# Patient Record
Sex: Female | Born: 1985 | Race: White | Hispanic: No | Marital: Married | Smoking: Never smoker
Health system: Southern US, Community
[De-identification: ages and names within clinical notes are randomized; demographics above are authoritative.]

## PROBLEM LIST (undated history)

## (undated) HISTORY — PX: WISDOM TOOTH EXTRACTION: SHX21

---

## 2016-02-18 DIAGNOSIS — O480 Post-term pregnancy: Secondary | ICD-10-CM | POA: Diagnosis not present

## 2016-02-22 DIAGNOSIS — O480 Post-term pregnancy: Secondary | ICD-10-CM | POA: Diagnosis not present

## 2016-02-22 DIAGNOSIS — Z3A41 41 weeks gestation of pregnancy: Secondary | ICD-10-CM | POA: Diagnosis not present

## 2016-02-22 DIAGNOSIS — Z3483 Encounter for supervision of other normal pregnancy, third trimester: Secondary | ICD-10-CM | POA: Diagnosis not present

## 2016-02-23 DIAGNOSIS — Z3A41 41 weeks gestation of pregnancy: Secondary | ICD-10-CM | POA: Diagnosis not present

## 2016-02-23 DIAGNOSIS — O480 Post-term pregnancy: Secondary | ICD-10-CM | POA: Diagnosis not present

## 2016-03-31 DIAGNOSIS — Z23 Encounter for immunization: Secondary | ICD-10-CM | POA: Diagnosis not present

## 2016-04-02 DIAGNOSIS — L918 Other hypertrophic disorders of the skin: Secondary | ICD-10-CM | POA: Diagnosis not present

## 2016-04-02 DIAGNOSIS — L700 Acne vulgaris: Secondary | ICD-10-CM | POA: Diagnosis not present

## 2016-07-08 DIAGNOSIS — K589 Irritable bowel syndrome without diarrhea: Secondary | ICD-10-CM | POA: Diagnosis not present

## 2016-07-08 DIAGNOSIS — K602 Anal fissure, unspecified: Secondary | ICD-10-CM | POA: Diagnosis not present

## 2017-01-18 DIAGNOSIS — K6289 Other specified diseases of anus and rectum: Secondary | ICD-10-CM | POA: Diagnosis not present

## 2017-01-18 DIAGNOSIS — K644 Residual hemorrhoidal skin tags: Secondary | ICD-10-CM | POA: Diagnosis not present

## 2017-02-22 DIAGNOSIS — K602 Anal fissure, unspecified: Secondary | ICD-10-CM | POA: Diagnosis not present

## 2017-02-22 DIAGNOSIS — K6289 Other specified diseases of anus and rectum: Secondary | ICD-10-CM | POA: Diagnosis not present

## 2017-02-22 DIAGNOSIS — R634 Abnormal weight loss: Secondary | ICD-10-CM | POA: Diagnosis not present

## 2017-02-22 DIAGNOSIS — K625 Hemorrhage of anus and rectum: Secondary | ICD-10-CM | POA: Diagnosis not present

## 2017-07-13 DIAGNOSIS — Z1151 Encounter for screening for human papillomavirus (HPV): Secondary | ICD-10-CM | POA: Diagnosis not present

## 2017-07-13 DIAGNOSIS — Z681 Body mass index (BMI) 19 or less, adult: Secondary | ICD-10-CM | POA: Diagnosis not present

## 2017-07-13 DIAGNOSIS — Z01419 Encounter for gynecological examination (general) (routine) without abnormal findings: Secondary | ICD-10-CM | POA: Diagnosis not present

## 2017-07-22 DIAGNOSIS — R5383 Other fatigue: Secondary | ICD-10-CM | POA: Diagnosis not present

## 2017-07-22 DIAGNOSIS — H6983 Other specified disorders of Eustachian tube, bilateral: Secondary | ICD-10-CM | POA: Diagnosis not present

## 2017-07-22 DIAGNOSIS — R5381 Other malaise: Secondary | ICD-10-CM | POA: Diagnosis not present

## 2017-07-22 DIAGNOSIS — H9203 Otalgia, bilateral: Secondary | ICD-10-CM | POA: Diagnosis not present

## 2017-07-22 DIAGNOSIS — J029 Acute pharyngitis, unspecified: Secondary | ICD-10-CM | POA: Diagnosis not present

## 2017-07-22 DIAGNOSIS — J028 Acute pharyngitis due to other specified organisms: Secondary | ICD-10-CM | POA: Diagnosis not present

## 2017-12-16 DIAGNOSIS — D2239 Melanocytic nevi of other parts of face: Secondary | ICD-10-CM | POA: Diagnosis not present

## 2017-12-16 DIAGNOSIS — L813 Cafe au lait spots: Secondary | ICD-10-CM | POA: Diagnosis not present

## 2017-12-16 DIAGNOSIS — D2271 Melanocytic nevi of right lower limb, including hip: Secondary | ICD-10-CM | POA: Diagnosis not present

## 2018-01-13 DIAGNOSIS — R1013 Epigastric pain: Secondary | ICD-10-CM | POA: Diagnosis not present

## 2018-01-13 DIAGNOSIS — Z13220 Encounter for screening for lipoid disorders: Secondary | ICD-10-CM | POA: Diagnosis not present

## 2018-01-13 DIAGNOSIS — K648 Other hemorrhoids: Secondary | ICD-10-CM | POA: Diagnosis not present

## 2018-01-25 DIAGNOSIS — Z0001 Encounter for general adult medical examination with abnormal findings: Secondary | ICD-10-CM | POA: Diagnosis not present

## 2018-01-25 DIAGNOSIS — R17 Unspecified jaundice: Secondary | ICD-10-CM | POA: Diagnosis not present

## 2018-01-25 DIAGNOSIS — R1013 Epigastric pain: Secondary | ICD-10-CM | POA: Diagnosis not present

## 2018-07-25 ENCOUNTER — Other Ambulatory Visit: Payer: Self-pay | Admitting: Internal Medicine

## 2018-07-25 DIAGNOSIS — R110 Nausea: Secondary | ICD-10-CM | POA: Diagnosis not present

## 2018-07-25 DIAGNOSIS — R17 Unspecified jaundice: Secondary | ICD-10-CM | POA: Diagnosis not present

## 2018-07-25 DIAGNOSIS — R002 Palpitations: Secondary | ICD-10-CM | POA: Diagnosis not present

## 2018-07-25 DIAGNOSIS — G4489 Other headache syndrome: Secondary | ICD-10-CM | POA: Diagnosis not present

## 2018-07-28 DIAGNOSIS — Z681 Body mass index (BMI) 19 or less, adult: Secondary | ICD-10-CM | POA: Diagnosis not present

## 2018-07-28 DIAGNOSIS — Z01419 Encounter for gynecological examination (general) (routine) without abnormal findings: Secondary | ICD-10-CM | POA: Diagnosis not present

## 2018-07-28 DIAGNOSIS — Z124 Encounter for screening for malignant neoplasm of cervix: Secondary | ICD-10-CM | POA: Diagnosis not present

## 2018-08-03 ENCOUNTER — Ambulatory Visit
Admission: RE | Admit: 2018-08-03 | Discharge: 2018-08-03 | Disposition: A | Discharge status: Home / Self Care | Payer: BLUE CROSS/BLUE SHIELD | Source: Ambulatory Visit | Attending: Internal Medicine | Admitting: Internal Medicine

## 2018-08-03 DIAGNOSIS — R17 Unspecified jaundice: Secondary | ICD-10-CM

## 2018-08-03 DIAGNOSIS — R110 Nausea: Secondary | ICD-10-CM

## 2018-08-03 MED ORDER — IOPAMIDOL (ISOVUE-300) INJECTION 61%
100.0000 mL | Freq: Once | INTRAVENOUS | Status: AC | PRN
  Administered 2018-08-03: 100 mL via INTRAVENOUS

## 2018-08-07 DIAGNOSIS — R002 Palpitations: Secondary | ICD-10-CM | POA: Diagnosis not present

## 2018-08-07 DIAGNOSIS — R17 Unspecified jaundice: Secondary | ICD-10-CM | POA: Diagnosis not present

## 2018-08-07 DIAGNOSIS — G4489 Other headache syndrome: Secondary | ICD-10-CM | POA: Diagnosis not present

## 2018-08-15 ENCOUNTER — Ambulatory Visit: Payer: Self-pay | Admitting: Nurse Practitioner

## 2018-08-15 VITALS — BP 120/65 | HR 102 | Temp 99.1°F | Resp 16 | Wt 113.4 lb

## 2018-08-15 DIAGNOSIS — R05 Cough: Secondary | ICD-10-CM

## 2018-08-15 DIAGNOSIS — J069 Acute upper respiratory infection, unspecified: Secondary | ICD-10-CM

## 2018-08-15 DIAGNOSIS — R6889 Other general symptoms and signs: Secondary | ICD-10-CM

## 2018-08-15 DIAGNOSIS — R059 Cough, unspecified: Secondary | ICD-10-CM

## 2018-08-15 DIAGNOSIS — B9789 Other viral agents as the cause of diseases classified elsewhere: Secondary | ICD-10-CM

## 2018-08-15 LAB — POCT INFLUENZA A/B
Influenza A, POC: NEGATIVE
Influenza B, POC: NEGATIVE

## 2018-08-15 MED ORDER — PSEUDOEPH-BROMPHEN-DM 30-2-10 MG/5ML PO SYRP: 5.0000 mL | ORAL_SOLUTION | Freq: Four times a day (QID) | ORAL | 0 refills | Status: AC | PRN

## 2018-08-15 NOTE — Progress Notes (Signed)
Subjective:    Patient ID: Joan Reynolds, female    DOB: Apr 24, 1986, 33 y.o.   MRN: [id number]  The patient is a 33 y.o. female who presents with a chief complaint of cough, chills, runny nose, chest tightness with cough, bodyaches and scratchy throat. The patient denies fever, sinus pressure, headache, ear pain, nasal congestion, abdominal pain, nausea, vomiting or diarrhea.  Patient has not taken anything for her symptoms to date.  Patient has had an influenza vaccine this season. Denies history of smoking, asthma or bronchitis. Patient requesting influenza test as she has 2 small children at home.  Cough  This is a new problem. The current episode started yesterday. The problem has been unchanged. The problem occurs every few hours. Associated symptoms include chills, rhinorrhea and a sore throat ( 2/2 to cough). Pertinent negatives include no chest pain, ear congestion, ear pain, fever, headaches, nasal congestion, postnasal drip, shortness of breath, sweats or wheezing. Nothing aggravates the symptoms. She has tried nothing for the symptoms. The treatment provided no relief. There is no history of asthma, bronchitis, COPD or pneumonia.   No past medical history on file.   Current Outpatient Medications:  Marland Kitchen  Multiple Vitamins-Minerals (MULTIVITAL PO), Take by mouth., Disp: , Rfl:  .  brompheniramine-pseudoephedrine-DM 30-2-10 MG/5ML syrup, Take 5 mLs by mouth 4 (four) times daily as needed for up to 7 days., Disp: 150 mL, Rfl: 0   Review of Systems  Constitutional: Positive for chills. Negative for activity change, appetite change and fever.  HENT: Positive for rhinorrhea and sore throat ( 2/2 to cough). Negative for ear pain, postnasal drip, sinus pain, sneezing and trouble swallowing.   Eyes: Negative.   Respiratory: Positive for cough and chest tightness (mild). Negative for shortness of breath, wheezing and stridor.   Cardiovascular: Negative.  Negative for chest pain.   Gastrointestinal: Negative.   Skin: Negative.   Neurological: Negative for dizziness, seizures, facial asymmetry, numbness and headaches.       Objective: Blood pressure 120/65, pulse (!) 102, temperature 99.1 F (37.3 C), temperature source Oral, resp. rate 16, weight 113 lb 6.4 oz (51.4 kg), SpO2 99 %.   Physical Exam Vitals signs reviewed.  Constitutional:      General: She is not in acute distress. HENT:     Head: Normocephalic.     Right Ear: Tympanic membrane and ear canal normal. No middle ear effusion.     Left Ear: Tympanic membrane and ear canal normal.  No middle ear effusion.     Nose: Rhinorrhea present. No congestion.     Right Turbinates: Enlarged and swollen.     Left Turbinates: Enlarged and swollen.     Right Sinus: No maxillary sinus tenderness or frontal sinus tenderness.     Left Sinus: No maxillary sinus tenderness or frontal sinus tenderness.     Mouth/Throat:     Lips: Pink.     Mouth: Mucous membranes are moist.     Pharynx: Uvula midline. Posterior oropharyngeal erythema present. No pharyngeal swelling, oropharyngeal exudate or uvula swelling.     Tonsils: No tonsillar exudate. Swelling: 0 on the right. 0 on the left.  Eyes:     Pupils: Pupils are equal, round, and reactive to light.  Neck:     Musculoskeletal: Normal range of motion and neck supple.  Cardiovascular:     Rate and Rhythm: Normal rate and regular rhythm.  Pulmonary:     Effort: Pulmonary effort is normal. No respiratory distress.  Breath sounds: Normal breath sounds. No stridor. No wheezing, rhonchi or rales.  Abdominal:     General: Bowel sounds are normal.     Palpations: Abdomen is soft.     Tenderness: There is no abdominal tenderness.  Lymphadenopathy:     Cervical: No cervical adenopathy.  Skin:    General: Skin is warm and dry.     Capillary Refill: Capillary refill takes less than 2 seconds.  Neurological:     General: No focal deficit present.     Mental Status: She  is alert and oriented to person, place, and time.     Cranial Nerves: No cranial nerve deficit.  Psychiatric:        Mood and Affect: Mood normal.        Thought Content: Thought content normal.   Manual HR: 98    Assessment & Plan:   Exam findings, diagnosis etiology and medication use and indications reviewed with patient. Follow- Up and discharge instructions provided. No emergent/urgent issues found on exam. Patient is well-appearing, is in no acute distress and vitals signs are stable, Manual HR: 98. Patient has not displayed fever, but has had some other subjective signs of influenza to include dry cough, chills, body aches and nasal congestion.  Influenza test was performed, which resulted negative.  Will provide the patient with symptomatic treatment to include Bromfed for her cough.  Patient was also instructed to increase fluids, use anti-pyretics as needed. Patient was instructed to follow up if symptoms do not improve.  Patient education was provided. Patient verbalized understanding of information provided and agrees with plan of care (POC), all questions answered. The patient is advised to call or return to clinic if condition does not see an improvement in symptoms, or to seek the care of the closest emergency department if condition worsens with the above plan.  1. Flu-like symptoms  - POCT Influenza A/B  2. Viral upper respiratory tract infection with cough  - brompheniramine-pseudoephedrine-DM 30-2-10 MG/5ML syrup; Take 5 mLs by mouth 4 (four) times daily as needed for up to 7 days.  Dispense: 150 mL; Refill: 0 -Take medication as prescribed. -Ibuprofen or Tylenol for pain, fever, or general discomfort. -Increase fluids. -Sleep elevated on at least 2 pillows at bedtime to help with cough. -Use a humidifier or vaporizer when at home and during sleep to help with cough. -May use a teaspoon of honey or over-the-counter cough drops to help with cough. -Warm saltwater gargles to  help with throat discomfort. -Follow-up if symptoms do not improve.   3. Cough  - brompheniramine-pseudoephedrine-DM 30-2-10 MG/5ML syrup; Take 5 mLs by mouth 4 (four) times daily as needed for up to 7 days.  Dispense: 150 mL; Refill: 0

## 2018-08-15 NOTE — Patient Instructions (Signed)
Viral Respiratory Infection -Take medication as prescribed. -Ibuprofen or Tylenol for pain, fever, or general discomfort. -Increase fluids. -Sleep elevated on at least 2 pillows at bedtime to help with cough. -Use a humidifier or vaporizer when at home and during sleep to help with cough. -May use a teaspoon of honey or over-the-counter cough drops to help with cough. -Warm saltwater gargles to help with throat discomfort. -Follow-up if symptoms do not improve.   A respiratory infection is an illness that affects part of the respiratory system, such as the lungs, nose, or throat. A respiratory infection that is caused by a virus is called a viral respiratory infection. Common types of viral respiratory infections include:  A cold.  The flu (influenza).  A respiratory syncytial virus (RSV) infection. What are the causes? This condition is caused by a virus. What are the signs or symptoms? Symptoms of this condition include:  A stuffy or runny nose.  Yellow or green nasal discharge.  A cough.  Sneezing.  Fatigue.  Achy muscles.  A sore throat.  Sweating or chills.  A fever.  A headache. How is this diagnosed? This condition may be diagnosed based on:  Your symptoms.  A physical exam.  Testing of nasal swabs. How is this treated? This condition may be treated with medicines, such as:  Antiviral medicine. This may shorten the length of time a person has symptoms.  Expectorants. These make it easier to cough up mucus.  Decongestant nasal sprays.  Acetaminophen or NSAIDs to relieve fever and pain. Antibiotic medicines are not prescribed for viral infections. This is because antibiotics are designed to kill bacteria. They are not effective against viruses. Follow these instructions at home:  Managing pain and congestion  Take over-the-counter and prescription medicines only as told by your health care provider.  If you have a sore throat, gargle with a  salt-water mixture 3-4 times a day or as needed. To make a salt-water mixture, completely dissolve -1 tsp of salt in 1 cup of warm water.  Use nose drops made from salt water to ease congestion and soften raw skin around your nose.  Drink enough fluid to keep your urine pale yellow. This helps prevent dehydration and helps loosen up mucus. General instructions  Rest as much as possible.  Do not drink alcohol.  Do not use any products that contain nicotine or tobacco, such as cigarettes and e-cigarettes. If you need help quitting, ask your health care provider.  Keep all follow-up visits as told by your health care provider. This is important. How is this prevented?   Get an annual flu shot. You may get the flu shot in late summer, fall, or winter. Ask your health care provider when you should get your flu shot.  Avoid exposing others to your respiratory infection. ? Stay home from work or school as told by your health care provider. ? Wash your hands with soap and water often, especially after you cough or sneeze. If soap and water are not available, use alcohol-based hand sanitizer.  Avoid contact with people who are sick during cold and flu season. This is generally fall and winter. Contact a health care provider if:  Your symptoms last for 10 days or longer.  Your symptoms get worse over time.  You have a fever.  You have severe sinus pain in your face or forehead.  The glands in your jaw or neck become very swollen. Get help right away if you:  Feel pain or pressure  in your chest.  Have shortness of breath.  Faint or feel like you will faint.  Have severe and persistent vomiting.  Feel confused or disoriented. Summary  A respiratory infection is an illness that affects part of the respiratory system, such as the lungs, nose, or throat. A respiratory infection that is caused by a virus is called a viral respiratory infection.  Common types of viral respiratory  infections are a cold, influenza, and respiratory syncytial virus (RSV) infection.  Symptoms of this condition include a stuffy or runny nose, cough, sneezing, fatigue, achy muscles, sore throat, and fevers or chills.  Antibiotic medicines are not prescribed for viral infections. This is because antibiotics are designed to kill bacteria. They are not effective against viruses. This information is not intended to replace advice given to you by your health care provider. Make sure you discuss any questions you have with your health care provider. Document Released: 03/10/2005 Document Revised: 07/11/2017 Document Reviewed: 07/11/2017 Elsevier Interactive Patient Education  2019 [employer]  Cough, Adult  Coughing is a reflex that clears your throat and your airways. Coughing helps to heal and protect your lungs. It is normal to cough occasionally, but a cough that happens with other symptoms or lasts a long time may be a sign of a condition that needs treatment. A cough may last only 2-3 weeks (acute), or it may last longer than 8 weeks (chronic). What are the causes? Coughing is commonly caused by:  Breathing in substances that irritate your lungs.  A viral or bacterial respiratory infection.  Allergies.  Asthma.  Postnasal drip.  Smoking.  Acid backing up from the stomach into the esophagus (gastroesophageal reflux).  Certain medicines.  Chronic lung problems, including COPD (or rarely, lung cancer).  Other medical conditions such as heart failure. Follow these instructions at home: Pay attention to any changes in your symptoms. Take these actions to help with your discomfort:  Take medicines only as told by your health care provider. ? If you were prescribed an antibiotic medicine, take it as told by your health care provider. Do not stop taking the antibiotic even if you start to feel better. ? Talk with your health care provider before you take a cough suppressant  medicine.  Drink enough fluid to keep your urine clear or pale yellow.  If the air is dry, use a cold steam vaporizer or humidifier in your bedroom or your home to help loosen secretions.  Avoid anything that causes you to cough at work or at home.  If your cough is worse at night, try sleeping in a semi-upright position.  Avoid cigarette smoke. If you smoke, quit smoking. If you need help quitting, ask your health care provider.  Avoid caffeine.  Avoid alcohol.  Rest as needed. Contact a health care provider if:  You have new symptoms.  You cough up pus.  Your cough does not get better after 2-3 weeks, or your cough gets worse.  You cannot control your cough with suppressant medicines and you are losing sleep.  You develop pain that is getting worse or pain that is not controlled with pain medicines.  You have a fever.  You have unexplained weight loss.  You have night sweats. Get help right away if:  You cough up blood.  You have difficulty breathing.  Your heartbeat is very fast. This information is not intended to replace advice given to you by your health care provider. Make sure you discuss any questions you have  with your health care provider. Document Released: 11/27/2010 Document Revised: 11/06/2015 Document Reviewed: 08/07/2014 Elsevier Interactive Patient Education  2019 [employer].

## 2018-08-17 ENCOUNTER — Telehealth: Payer: Self-pay

## 2018-08-17 NOTE — Telephone Encounter (Signed)
LM on pt vm tcb regarding how she was feeling since her visit with [place].

## 2018-10-03 DIAGNOSIS — R350 Frequency of micturition: Secondary | ICD-10-CM | POA: Diagnosis not present

## 2018-12-20 DIAGNOSIS — L813 Cafe au lait spots: Secondary | ICD-10-CM | POA: Diagnosis not present

## 2018-12-20 DIAGNOSIS — D2272 Melanocytic nevi of left lower limb, including hip: Secondary | ICD-10-CM | POA: Diagnosis not present

## 2018-12-20 DIAGNOSIS — D2261 Melanocytic nevi of right upper limb, including shoulder: Secondary | ICD-10-CM | POA: Diagnosis not present

## 2018-12-20 DIAGNOSIS — D2271 Melanocytic nevi of right lower limb, including hip: Secondary | ICD-10-CM | POA: Diagnosis not present

## 2019-02-02 DIAGNOSIS — G4489 Other headache syndrome: Secondary | ICD-10-CM | POA: Diagnosis not present

## 2019-02-02 DIAGNOSIS — R17 Unspecified jaundice: Secondary | ICD-10-CM | POA: Diagnosis not present

## 2019-02-02 DIAGNOSIS — Z0000 Encounter for general adult medical examination without abnormal findings: Secondary | ICD-10-CM | POA: Diagnosis not present

## 2019-04-30 DIAGNOSIS — H6122 Impacted cerumen, left ear: Secondary | ICD-10-CM | POA: Diagnosis not present

## 2019-04-30 DIAGNOSIS — H9012 Conductive hearing loss, unilateral, left ear, with unrestricted hearing on the contralateral side: Secondary | ICD-10-CM | POA: Diagnosis not present

## 2019-05-23 DIAGNOSIS — H6122 Impacted cerumen, left ear: Secondary | ICD-10-CM | POA: Diagnosis not present

## 2019-05-23 DIAGNOSIS — M25531 Pain in right wrist: Secondary | ICD-10-CM | POA: Diagnosis not present

## 2019-05-23 DIAGNOSIS — J302 Other seasonal allergic rhinitis: Secondary | ICD-10-CM | POA: Diagnosis not present

## 2019-06-04 DIAGNOSIS — H6122 Impacted cerumen, left ear: Secondary | ICD-10-CM | POA: Diagnosis not present

## 2019-08-29 DIAGNOSIS — D2239 Melanocytic nevi of other parts of face: Secondary | ICD-10-CM | POA: Diagnosis not present

## 2019-09-05 DIAGNOSIS — D2239 Melanocytic nevi of other parts of face: Secondary | ICD-10-CM | POA: Diagnosis not present

## 2019-12-26 DIAGNOSIS — Z01419 Encounter for gynecological examination (general) (routine) without abnormal findings: Secondary | ICD-10-CM | POA: Diagnosis not present

## 2019-12-26 DIAGNOSIS — Z681 Body mass index (BMI) 19 or less, adult: Secondary | ICD-10-CM | POA: Diagnosis not present

## 2020-02-04 DIAGNOSIS — Z0000 Encounter for general adult medical examination without abnormal findings: Secondary | ICD-10-CM | POA: Diagnosis not present

## 2020-02-13 DIAGNOSIS — D2261 Melanocytic nevi of right upper limb, including shoulder: Secondary | ICD-10-CM | POA: Diagnosis not present

## 2020-02-13 DIAGNOSIS — L700 Acne vulgaris: Secondary | ICD-10-CM | POA: Diagnosis not present

## 2020-02-13 DIAGNOSIS — L813 Cafe au lait spots: Secondary | ICD-10-CM | POA: Diagnosis not present

## 2020-02-13 DIAGNOSIS — D2271 Melanocytic nevi of right lower limb, including hip: Secondary | ICD-10-CM | POA: Diagnosis not present

## 2020-03-27 DIAGNOSIS — R140 Abdominal distension (gaseous): Secondary | ICD-10-CM | POA: Diagnosis not present

## 2020-03-27 DIAGNOSIS — R109 Unspecified abdominal pain: Secondary | ICD-10-CM | POA: Diagnosis not present

## 2020-05-13 DIAGNOSIS — K601 Chronic anal fissure: Secondary | ICD-10-CM | POA: Diagnosis not present

## 2020-05-13 DIAGNOSIS — R1033 Periumbilical pain: Secondary | ICD-10-CM | POA: Diagnosis not present

## 2020-05-13 DIAGNOSIS — K6289 Other specified diseases of anus and rectum: Secondary | ICD-10-CM | POA: Diagnosis not present

## 2020-05-15 ENCOUNTER — Other Ambulatory Visit: Payer: Self-pay | Admitting: Gastroenterology

## 2020-05-15 DIAGNOSIS — R1033 Periumbilical pain: Secondary | ICD-10-CM

## 2020-06-03 ENCOUNTER — Other Ambulatory Visit: Payer: Self-pay

## 2020-06-03 ENCOUNTER — Ambulatory Visit
Admission: RE | Admit: 2020-06-03 | Discharge: 2020-06-03 | Disposition: A | Discharge status: Home / Self Care | Payer: Self-pay | Source: Ambulatory Visit | Attending: Gastroenterology | Admitting: Gastroenterology

## 2020-06-03 DIAGNOSIS — R1033 Periumbilical pain: Secondary | ICD-10-CM | POA: Diagnosis not present

## 2020-06-03 DIAGNOSIS — N8300 Follicular cyst of ovary, unspecified side: Secondary | ICD-10-CM | POA: Diagnosis not present

## 2020-06-03 DIAGNOSIS — R197 Diarrhea, unspecified: Secondary | ICD-10-CM | POA: Diagnosis not present

## 2020-06-03 MED ORDER — IOPAMIDOL (ISOVUE-300) INJECTION 61%
100.0000 mL | Freq: Once | INTRAVENOUS | Status: AC | PRN
  Administered 2020-06-03: 100 mL via INTRAVENOUS

## 2020-07-11 DIAGNOSIS — R102 Pelvic and perineal pain: Secondary | ICD-10-CM | POA: Diagnosis not present

## 2020-08-04 DIAGNOSIS — R197 Diarrhea, unspecified: Secondary | ICD-10-CM | POA: Diagnosis not present

## 2020-08-04 DIAGNOSIS — R1030 Lower abdominal pain, unspecified: Secondary | ICD-10-CM | POA: Diagnosis not present

## 2020-08-05 DIAGNOSIS — R1030 Lower abdominal pain, unspecified: Secondary | ICD-10-CM | POA: Diagnosis not present

## 2020-08-05 DIAGNOSIS — R197 Diarrhea, unspecified: Secondary | ICD-10-CM | POA: Diagnosis not present

## 2020-09-02 DIAGNOSIS — K580 Irritable bowel syndrome with diarrhea: Secondary | ICD-10-CM | POA: Diagnosis not present

## 2020-09-02 DIAGNOSIS — K645 Perianal venous thrombosis: Secondary | ICD-10-CM | POA: Diagnosis not present

## 2020-09-05 DIAGNOSIS — Y92328 Other athletic field as the place of occurrence of the external cause: Secondary | ICD-10-CM | POA: Diagnosis not present

## 2020-09-05 DIAGNOSIS — M25571 Pain in right ankle and joints of right foot: Secondary | ICD-10-CM | POA: Diagnosis not present

## 2020-09-05 DIAGNOSIS — S93431A Sprain of tibiofibular ligament of right ankle, initial encounter: Secondary | ICD-10-CM | POA: Diagnosis not present

## 2020-09-05 DIAGNOSIS — X501XXA Overexertion from prolonged static or awkward postures, initial encounter: Secondary | ICD-10-CM | POA: Diagnosis not present

## 2020-09-05 DIAGNOSIS — S99912A Unspecified injury of left ankle, initial encounter: Secondary | ICD-10-CM | POA: Diagnosis not present

## 2020-09-10 DIAGNOSIS — S93491A Sprain of other ligament of right ankle, initial encounter: Secondary | ICD-10-CM | POA: Diagnosis not present

## 2020-09-24 DIAGNOSIS — M7741 Metatarsalgia, right foot: Secondary | ICD-10-CM | POA: Diagnosis not present

## 2020-09-24 DIAGNOSIS — J302 Other seasonal allergic rhinitis: Secondary | ICD-10-CM | POA: Diagnosis not present

## 2020-09-24 DIAGNOSIS — H8113 Benign paroxysmal vertigo, bilateral: Secondary | ICD-10-CM | POA: Diagnosis not present

## 2020-11-05 DIAGNOSIS — K648 Other hemorrhoids: Secondary | ICD-10-CM | POA: Diagnosis not present

## 2021-01-05 DIAGNOSIS — D2272 Melanocytic nevi of left lower limb, including hip: Secondary | ICD-10-CM | POA: Diagnosis not present

## 2021-01-05 DIAGNOSIS — L700 Acne vulgaris: Secondary | ICD-10-CM | POA: Diagnosis not present

## 2021-01-05 DIAGNOSIS — D2262 Melanocytic nevi of left upper limb, including shoulder: Secondary | ICD-10-CM | POA: Diagnosis not present

## 2021-01-05 DIAGNOSIS — L814 Other melanin hyperpigmentation: Secondary | ICD-10-CM | POA: Diagnosis not present

## 2021-01-06 DIAGNOSIS — S93491A Sprain of other ligament of right ankle, initial encounter: Secondary | ICD-10-CM | POA: Diagnosis not present

## 2021-01-15 DIAGNOSIS — M25571 Pain in right ankle and joints of right foot: Secondary | ICD-10-CM | POA: Diagnosis not present

## 2021-01-16 DIAGNOSIS — S93491A Sprain of other ligament of right ankle, initial encounter: Secondary | ICD-10-CM | POA: Diagnosis not present

## 2021-01-16 DIAGNOSIS — S93492A Sprain of other ligament of left ankle, initial encounter: Secondary | ICD-10-CM | POA: Diagnosis not present

## 2021-01-23 DIAGNOSIS — S93491D Sprain of other ligament of right ankle, subsequent encounter: Secondary | ICD-10-CM | POA: Diagnosis not present

## 2021-01-30 DIAGNOSIS — S93491D Sprain of other ligament of right ankle, subsequent encounter: Secondary | ICD-10-CM | POA: Diagnosis not present

## 2021-02-04 DIAGNOSIS — S93491D Sprain of other ligament of right ankle, subsequent encounter: Secondary | ICD-10-CM | POA: Diagnosis not present

## 2021-02-05 DIAGNOSIS — R1013 Epigastric pain: Secondary | ICD-10-CM | POA: Diagnosis not present

## 2021-02-05 DIAGNOSIS — Z0000 Encounter for general adult medical examination without abnormal findings: Secondary | ICD-10-CM | POA: Diagnosis not present

## 2021-02-18 DIAGNOSIS — S93491D Sprain of other ligament of right ankle, subsequent encounter: Secondary | ICD-10-CM | POA: Diagnosis not present

## 2021-03-31 DIAGNOSIS — K580 Irritable bowel syndrome with diarrhea: Secondary | ICD-10-CM | POA: Diagnosis not present

## 2021-03-31 DIAGNOSIS — K625 Hemorrhage of anus and rectum: Secondary | ICD-10-CM | POA: Diagnosis not present

## 2021-04-27 DIAGNOSIS — Z124 Encounter for screening for malignant neoplasm of cervix: Secondary | ICD-10-CM | POA: Diagnosis not present

## 2021-04-27 DIAGNOSIS — Z01411 Encounter for gynecological examination (general) (routine) with abnormal findings: Secondary | ICD-10-CM | POA: Diagnosis not present

## 2021-04-27 DIAGNOSIS — Z01419 Encounter for gynecological examination (general) (routine) without abnormal findings: Secondary | ICD-10-CM | POA: Diagnosis not present

## 2021-04-27 DIAGNOSIS — Z681 Body mass index (BMI) 19 or less, adult: Secondary | ICD-10-CM | POA: Diagnosis not present

## 2021-06-17 DIAGNOSIS — M25562 Pain in left knee: Secondary | ICD-10-CM | POA: Diagnosis not present

## 2021-06-23 DIAGNOSIS — M79652 Pain in left thigh: Secondary | ICD-10-CM | POA: Diagnosis not present

## 2021-06-26 DIAGNOSIS — M25562 Pain in left knee: Secondary | ICD-10-CM | POA: Diagnosis not present

## 2021-06-26 DIAGNOSIS — M79652 Pain in left thigh: Secondary | ICD-10-CM | POA: Diagnosis not present

## 2021-07-09 DIAGNOSIS — K580 Irritable bowel syndrome with diarrhea: Secondary | ICD-10-CM | POA: Diagnosis not present

## 2021-07-10 DIAGNOSIS — M6281 Muscle weakness (generalized): Secondary | ICD-10-CM | POA: Diagnosis not present

## 2021-07-10 DIAGNOSIS — M222X2 Patellofemoral disorders, left knee: Secondary | ICD-10-CM | POA: Diagnosis not present

## 2021-07-10 DIAGNOSIS — M25562 Pain in left knee: Secondary | ICD-10-CM | POA: Diagnosis not present

## 2021-07-10 DIAGNOSIS — D1622 Benign neoplasm of long bones of left lower limb: Secondary | ICD-10-CM | POA: Diagnosis not present

## 2021-07-14 DIAGNOSIS — M222X2 Patellofemoral disorders, left knee: Secondary | ICD-10-CM | POA: Diagnosis not present

## 2021-07-14 DIAGNOSIS — M6281 Muscle weakness (generalized): Secondary | ICD-10-CM | POA: Diagnosis not present

## 2021-07-14 DIAGNOSIS — M25562 Pain in left knee: Secondary | ICD-10-CM | POA: Diagnosis not present

## 2021-07-14 DIAGNOSIS — D1622 Benign neoplasm of long bones of left lower limb: Secondary | ICD-10-CM | POA: Diagnosis not present

## 2021-11-30 ENCOUNTER — Other Ambulatory Visit: Payer: Self-pay | Admitting: Internal Medicine

## 2021-11-30 ENCOUNTER — Ambulatory Visit
Admission: RE | Admit: 2021-11-30 | Discharge: 2021-11-30 | Disposition: A | Discharge status: Home / Self Care | Payer: BC Managed Care – PPO | Source: Ambulatory Visit | Attending: Internal Medicine | Admitting: Internal Medicine

## 2021-11-30 DIAGNOSIS — M533 Sacrococcygeal disorders, not elsewhere classified: Secondary | ICD-10-CM

## 2022-01-06 DIAGNOSIS — D225 Melanocytic nevi of trunk: Secondary | ICD-10-CM | POA: Diagnosis not present

## 2022-01-06 DIAGNOSIS — L814 Other melanin hyperpigmentation: Secondary | ICD-10-CM | POA: Diagnosis not present

## 2022-01-06 DIAGNOSIS — D2262 Melanocytic nevi of left upper limb, including shoulder: Secondary | ICD-10-CM | POA: Diagnosis not present

## 2022-01-06 DIAGNOSIS — L700 Acne vulgaris: Secondary | ICD-10-CM | POA: Diagnosis not present

## 2022-02-19 DIAGNOSIS — M533 Sacrococcygeal disorders, not elsewhere classified: Secondary | ICD-10-CM | POA: Diagnosis not present

## 2022-02-19 DIAGNOSIS — Z23 Encounter for immunization: Secondary | ICD-10-CM | POA: Diagnosis not present

## 2022-02-19 DIAGNOSIS — Z13220 Encounter for screening for lipoid disorders: Secondary | ICD-10-CM | POA: Diagnosis not present

## 2022-02-19 DIAGNOSIS — Z0000 Encounter for general adult medical examination without abnormal findings: Secondary | ICD-10-CM | POA: Diagnosis not present

## 2022-03-01 DIAGNOSIS — M533 Sacrococcygeal disorders, not elsewhere classified: Secondary | ICD-10-CM | POA: Diagnosis not present

## 2022-03-02 ENCOUNTER — Other Ambulatory Visit: Payer: Self-pay | Admitting: Sports Medicine

## 2022-03-02 DIAGNOSIS — M533 Sacrococcygeal disorders, not elsewhere classified: Secondary | ICD-10-CM

## 2022-03-18 ENCOUNTER — Ambulatory Visit
Admission: RE | Admit: 2022-03-18 | Discharge: 2022-03-18 | Disposition: A | Discharge status: Home / Self Care | Payer: BC Managed Care – PPO | Source: Ambulatory Visit | Attending: Sports Medicine | Admitting: Sports Medicine

## 2022-03-18 DIAGNOSIS — M533 Sacrococcygeal disorders, not elsewhere classified: Secondary | ICD-10-CM | POA: Diagnosis not present

## 2022-03-18 DIAGNOSIS — M5126 Other intervertebral disc displacement, lumbar region: Secondary | ICD-10-CM | POA: Diagnosis not present

## 2022-04-09 DIAGNOSIS — J302 Other seasonal allergic rhinitis: Secondary | ICD-10-CM | POA: Diagnosis not present

## 2022-04-09 DIAGNOSIS — J0110 Acute frontal sinusitis, unspecified: Secondary | ICD-10-CM | POA: Diagnosis not present

## 2022-04-14 DIAGNOSIS — M533 Sacrococcygeal disorders, not elsewhere classified: Secondary | ICD-10-CM | POA: Diagnosis not present

## 2022-04-29 DIAGNOSIS — Z681 Body mass index (BMI) 19 or less, adult: Secondary | ICD-10-CM | POA: Diagnosis not present

## 2022-04-29 DIAGNOSIS — Z01419 Encounter for gynecological examination (general) (routine) without abnormal findings: Secondary | ICD-10-CM | POA: Diagnosis not present

## 2022-04-29 DIAGNOSIS — M533 Sacrococcygeal disorders, not elsewhere classified: Secondary | ICD-10-CM | POA: Diagnosis not present

## 2022-07-15 IMAGING — CR DG SACRUM/COCCYX 2+V
3 series · 3 of 3 positions shown · non-contrast
Comparison: CT abdomen and pelvis 05/15/2020

CLINICAL DATA: Coccygeal pain for 2 months.

EXAM:
SACRUM AND COCCYX - 2+ VIEW

[t sacrum a.p.]
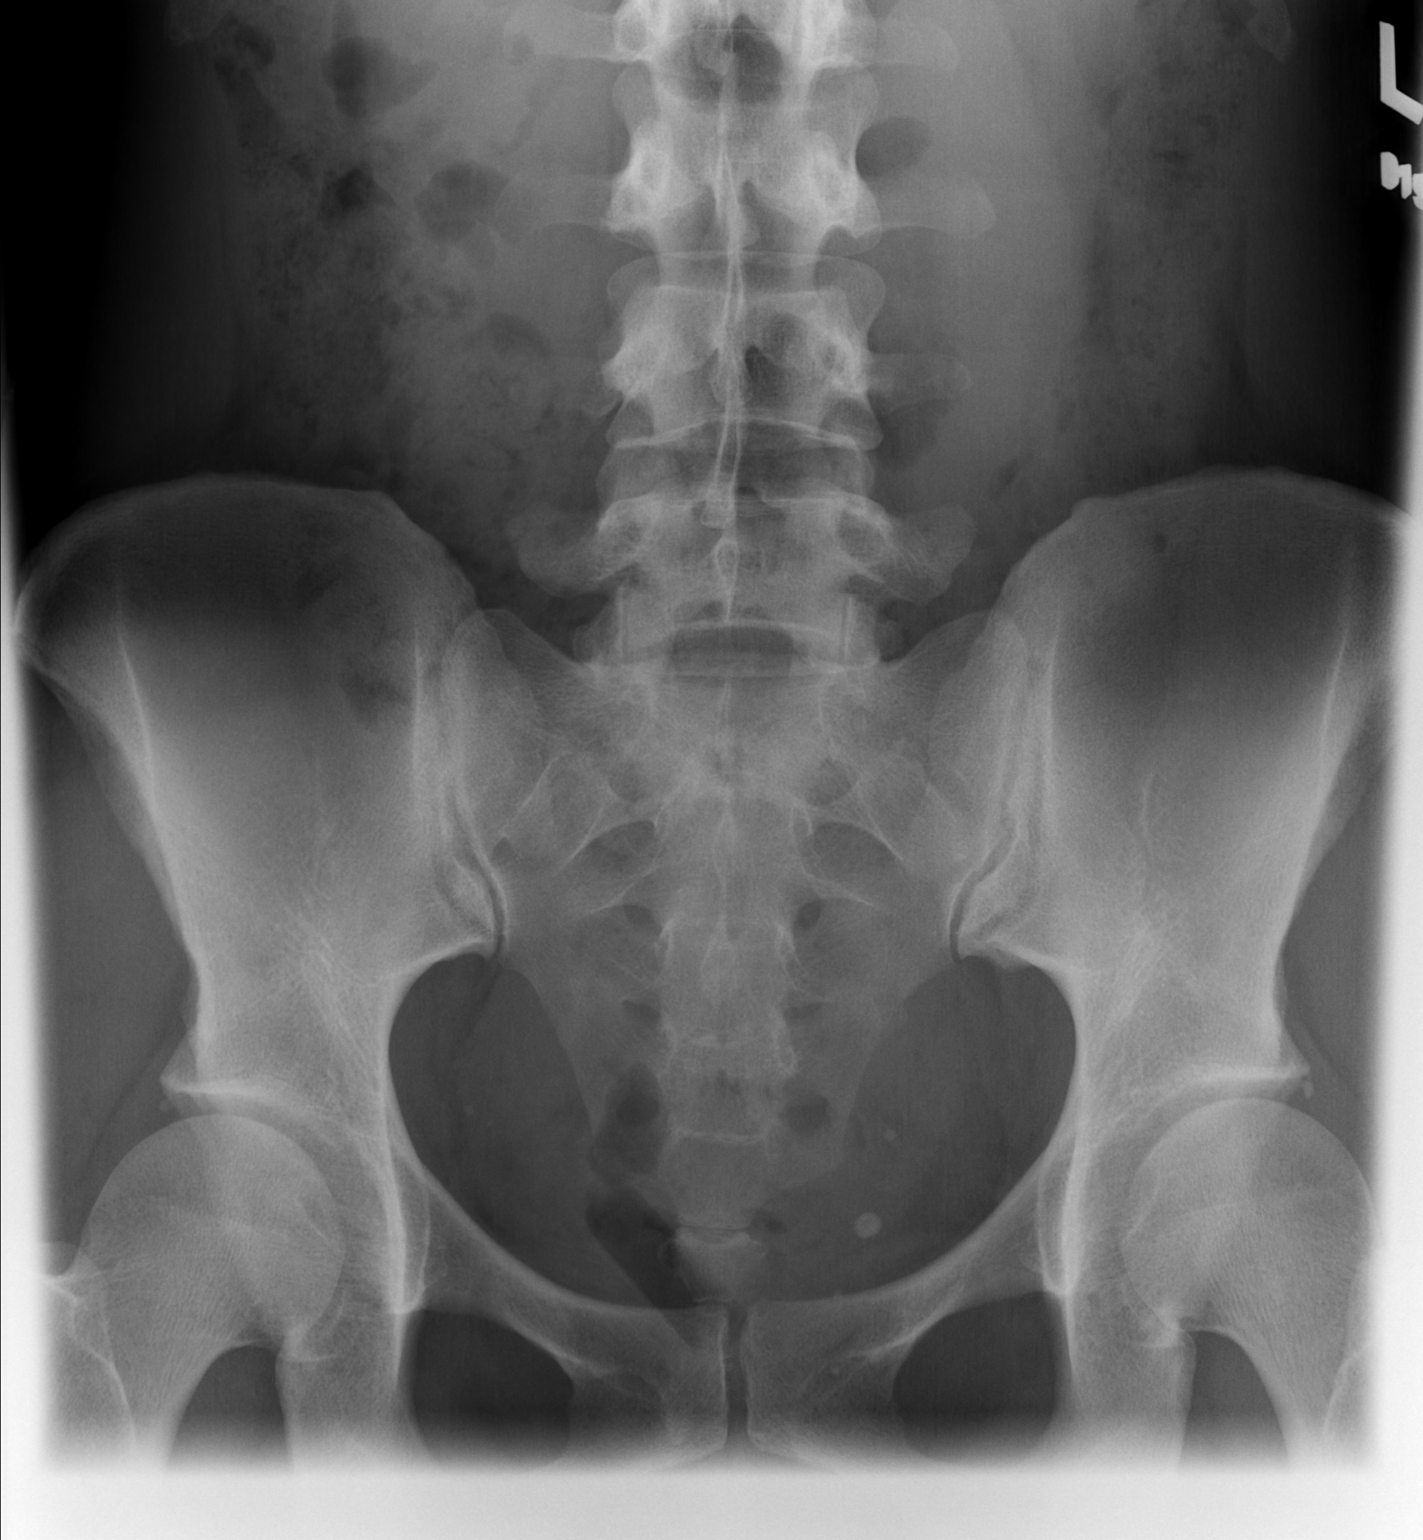

[t coccyx a.p.]
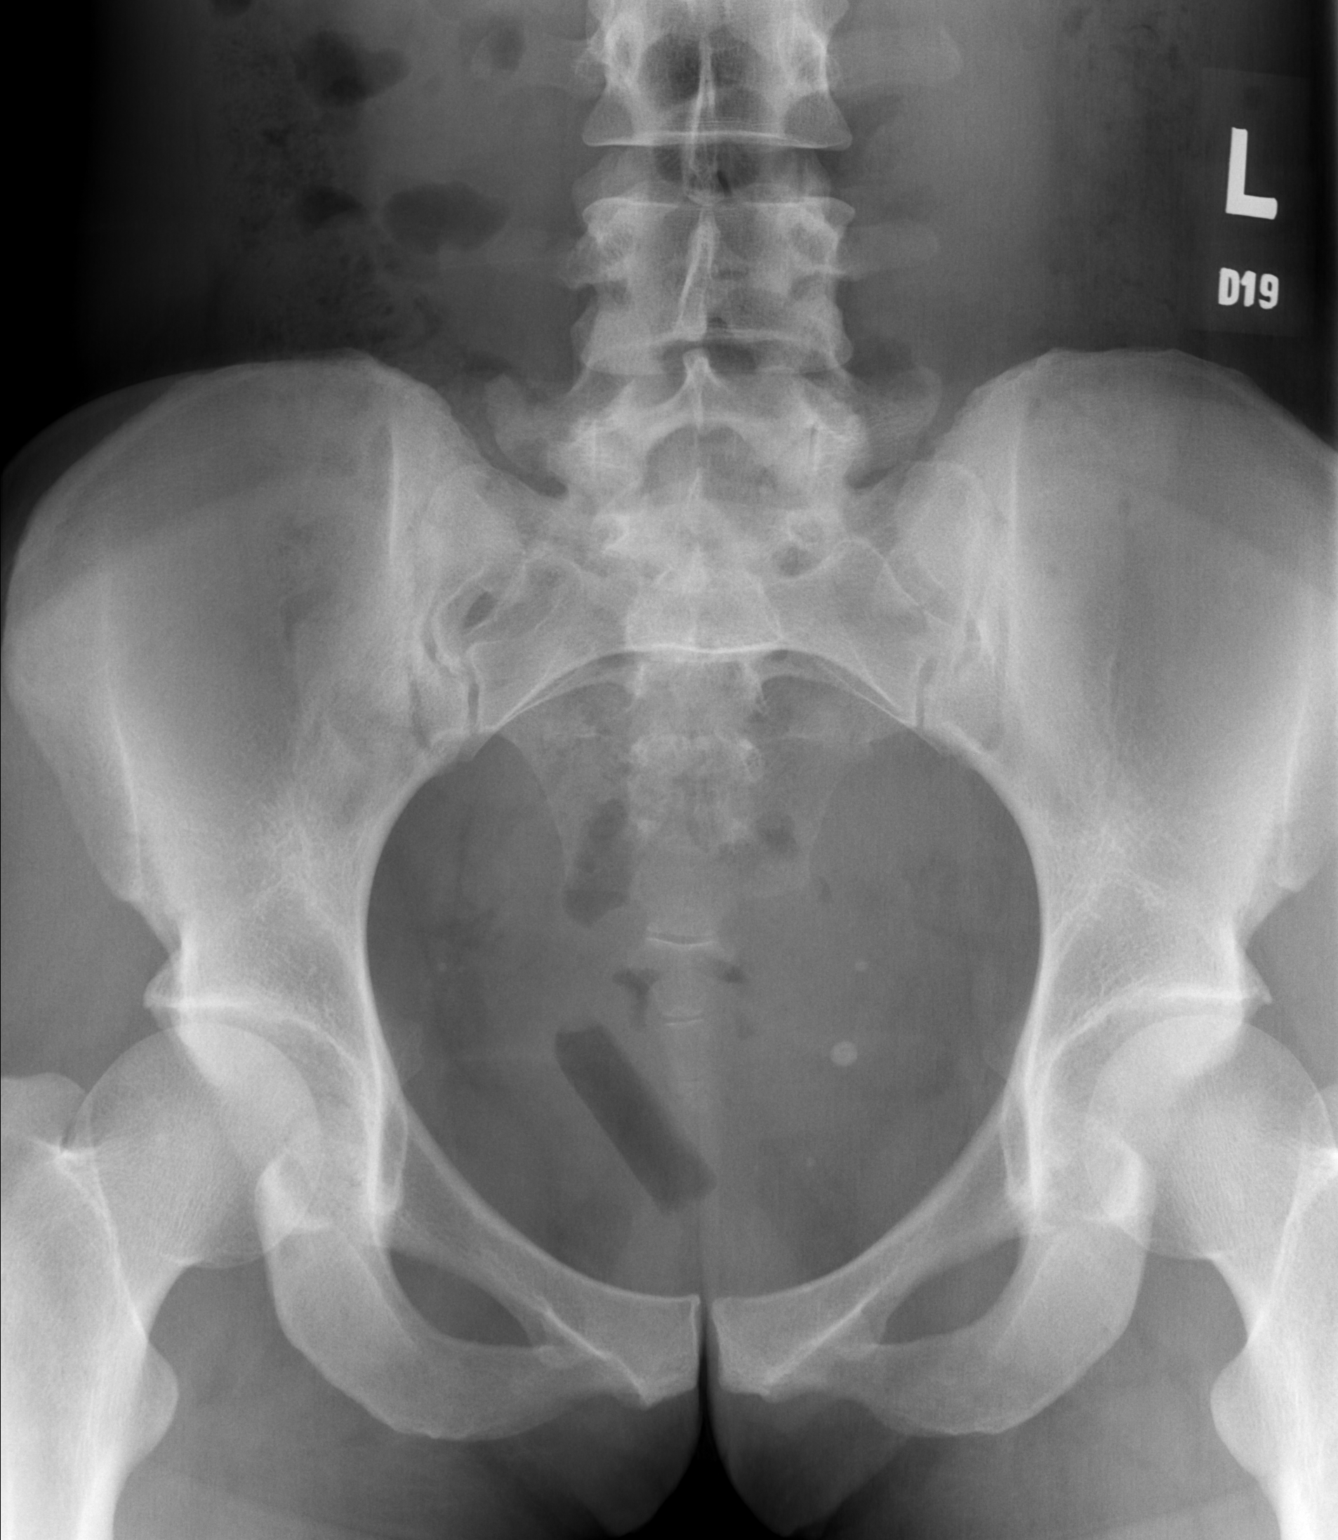

[t sacrum lat]
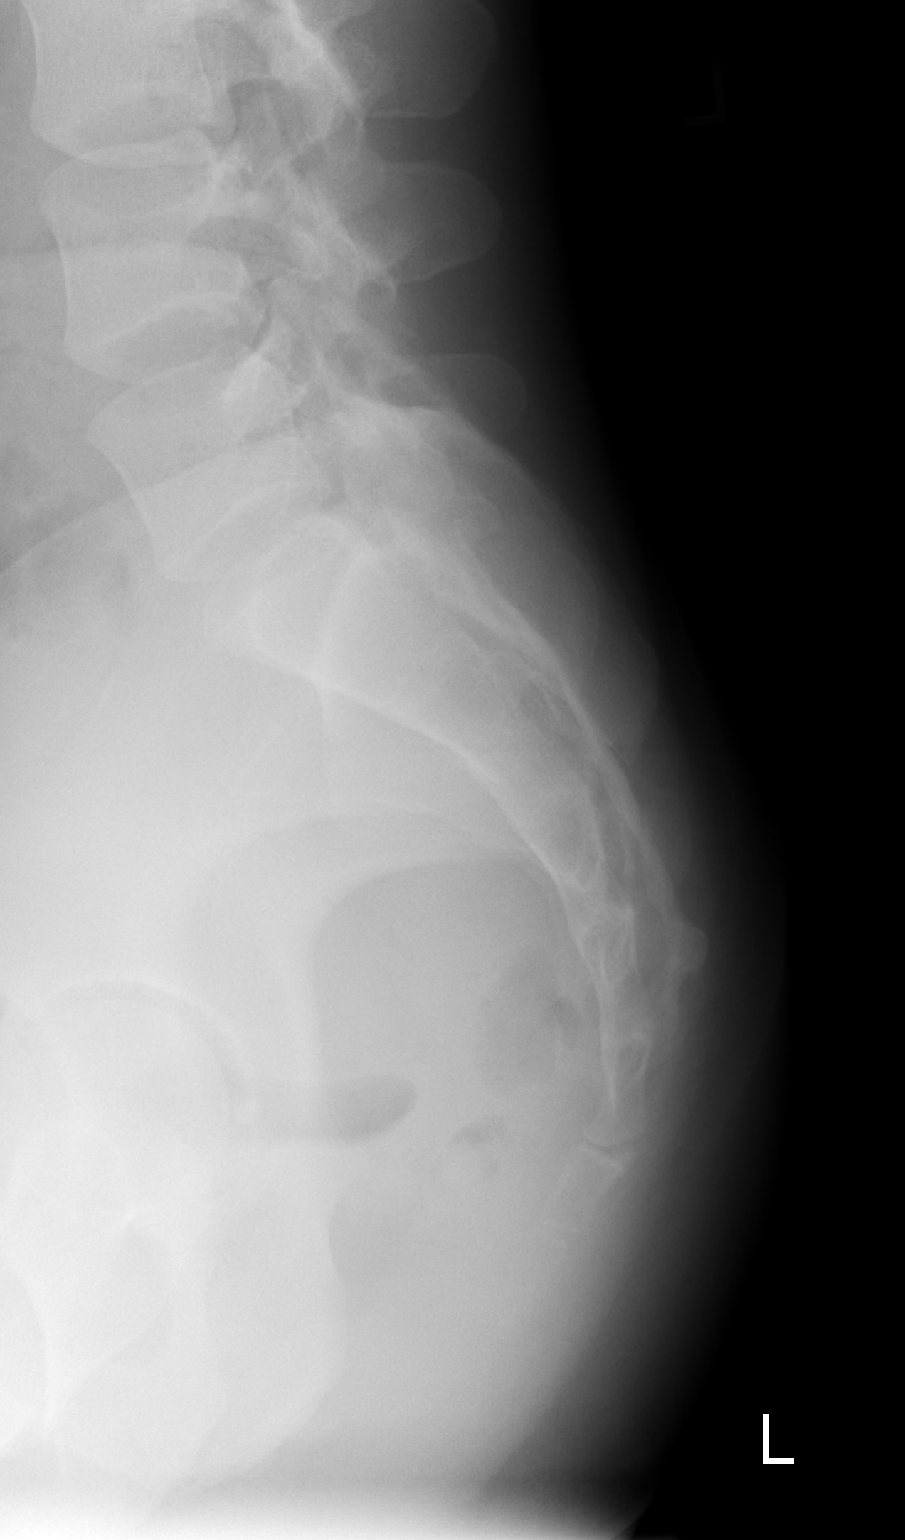

[3 of 3 positions shown; findings below may reference images not displayed]

FINDINGS: No fracture or destructive osseous process is identified. No pelvic
diastasis is evident. Small calcifications in the pelvis are
compatible with phleboliths.
IMPRESSION: Negative.

## 2024-03-15 ENCOUNTER — Encounter (INDEPENDENT_AMBULATORY_CARE_PROVIDER_SITE_OTHER): Payer: Self-pay | Admitting: Physician Assistant

## 2024-03-15 ENCOUNTER — Ambulatory Visit (INDEPENDENT_AMBULATORY_CARE_PROVIDER_SITE_OTHER): Admitting: Physician Assistant

## 2024-03-15 VITALS — BP 113/71 | HR 70 | Temp 98.2°F | Ht 62.0 in | Wt 115.0 lb

## 2024-03-15 DIAGNOSIS — H938X2 Other specified disorders of left ear: Secondary | ICD-10-CM | POA: Diagnosis not present

## 2024-03-15 NOTE — Progress Notes (Signed)
 Dear Dr. Fleeta Smock, Here is my assessment for our mutual patient, Joan Reynolds. Thank you for allowing me the opportunity to care for your patient. Please do not hesitate to contact me should you have any other questions. Sincerely, Chyrl Cohen PA-C  Otolaryngology Clinic Note Referring provider: Dr. Fleeta Smock HPI:  Joan Reynolds is a 38 y.o. female kindly referred by Dr. Fleeta Smock   The patient is a 38 year old female seen in our office for evaluation of ear related complaints.  The patient notes that for 4 to 5 years she has had left ear fullness and autophony.  She feels like this waxes and wanes but is fairly consistent.  Usually is worse in the morning and may improve towards the middle and the end of the day although she feels like she might be ignoring it.  She often feels like she can hear herself speaking in her left ear.  She denies any related pain, no infection or trauma, no clicking or popping.  She notes a history of seasonal allergies but does not feel that she suffers from allergies.  She has seen allergy and asthma and has been treated with Flonase and antihistamines which has not helped the left-sided ear related complaints.  She feels like her hearing is normal.  No significant weight loss recently although she did have 2 children over the last 10 years and lost a moderate amount of weight with breast-feeding.  She notes that when using the nasal steroids the symptoms did not worsen.  She has also seen a dentist and been evaluated for TMJ with no findings of TMJ.   Independent Review of Additional Tests or Records:  Allergy and asthma note 02/07/2024   PMH/Meds/All/SocHx/FamHx/ROS:  History reviewed. No pertinent past medical history.   History reviewed. No pertinent surgical history.  History reviewed. No pertinent family history.   Social Connections: Not on file      Current Outpatient Medications:    Multiple Vitamins-Minerals (MULTIVITAL PO), Take by mouth.,  Disp: , Rfl:    Physical Exam:   BP 113/71 (BP Location: Right Arm, Patient Position: Sitting, Cuff Size: Normal)   Pulse 70   Temp 98.2 F (36.8 C) (Oral)   Ht 5' 2 (1.575 m)   Wt 115 lb (52.2 kg)   SpO2 99%   BMI 21.03 kg/m   Pertinent Findings  CN II-XII intact Bilateral EAC clear and TM intact with well pneumatized middle ear spaces Anterior rhinoscopy: Septum midline; bilateral inferior turbinates with no hypertrophy No lesions of oral cavity/oropharynx; dentition within normal limits No obviously palpable neck masses/lymphadenopathy/thyromegaly No pain or crepitus with palpation of the TMJ No respiratory distress or stridor  Seprately Identifiable Procedures:  None  Impression & Plans:  Joan Reynolds is a 38 y.o. female with the following   Eustachian tube dysfunction-  38 year old female seen in our office for evaluation of left-sided ear related complaints.  She has some symptoms of patulous eustachian tube dysfunction with autophony in the left ear, she does have some pressure related symptoms although no significant pain, no changes to her hearing.  She has attempted maximal medical treatment for allergic rhinitis with no significant improvement, also no worsening of her symptoms.  Would like an audiological evaluation to help with further evaluation and management.  Once is available I will call the patient with the results and provide a plan moving forward.  I did discuss treatment options including persistent use of Flonase, daily antihistamine, and saline irrigation, if she  does have eustachian tube dysfunction this may improve her symptoms although she has tried this prior, sometimes daily Flonase will worsen symptoms and patulous eustachian tube dysfunction as well.   - f/u phone call discussion after audiological evaluation.   Thank you for allowing me the opportunity to care for your patient. Please do not hesitate to contact me should you have any other  questions.  Sincerely, Chyrl Cohen PA-C [facility] Phone: [phone] Fax: [phone]  03/15/2024, 11:32 AM

## 2024-03-26 ENCOUNTER — Ambulatory Visit (INDEPENDENT_AMBULATORY_CARE_PROVIDER_SITE_OTHER): Admitting: Audiology

## 2024-05-01 ENCOUNTER — Other Ambulatory Visit (INDEPENDENT_AMBULATORY_CARE_PROVIDER_SITE_OTHER): Payer: Self-pay | Admitting: Physician Assistant

## 2024-05-01 DIAGNOSIS — H938X2 Other specified disorders of left ear: Secondary | ICD-10-CM

## 2024-05-02 ENCOUNTER — Ambulatory Visit (INDEPENDENT_AMBULATORY_CARE_PROVIDER_SITE_OTHER): Admitting: Audiology

## 2024-05-23 ENCOUNTER — Encounter: Payer: Self-pay | Admitting: Cardiology

## 2024-05-23 ENCOUNTER — Ambulatory Visit: Attending: Cardiology | Admitting: Cardiology

## 2024-05-23 VITALS — BP 134/75 | HR 73 | Ht 62.0 in | Wt 115.0 lb

## 2024-05-23 DIAGNOSIS — R002 Palpitations: Secondary | ICD-10-CM

## 2024-05-23 DIAGNOSIS — I479 Paroxysmal tachycardia, unspecified: Secondary | ICD-10-CM | POA: Diagnosis not present

## 2024-05-23 MED ORDER — METOPROLOL SUCCINATE ER 25 MG PO TB24: 25.0000 mg | ORAL_TABLET | Freq: Every day | ORAL | 3 refills | Status: AC

## 2024-05-23 NOTE — Patient Instructions (Signed)
 Medication Instructions:   START METOPROLOL SUCC ER 25 MG ONCE DAILY AT BEDTIME  *If you need a refill on your cardiac medications before your next appointment, please call your pharmacy*  Testing/Procedures:  Your physician has requested that you have an echocardiogram. Echocardiography is a painless test that uses sound waves to create images of your heart. It provides your doctor with information about the size and shape of your heart and how well your hearts chambers and valves are working. This procedure takes approximately one hour. There are no restrictions for this procedure. Please do NOT wear cologne, perfume, aftershave, or lotions (deodorant is allowed). Please arrive 15 minutes prior to your appointment time.  Please note: We ask at that you not bring children with you during ultrasound (echo/ vascular) testing. Due to room size and safety concerns, children are not allowed in the ultrasound rooms during exams. Our front office staff cannot provide observation of children in our lobby area while testing is being conducted. An adult accompanying a patient to their appointment will only be allowed in the ultrasound room at the discretion of the ultrasound technician under special circumstances. We apologize for any inconvenience. [address]  Follow-Up: At [facility], you and your health needs are our priority.  As part of our continuing mission to provide you with exceptional heart care, our providers are all part of one team.  This team includes your primary Cardiologist (physician) and Advanced Practice Providers or APPs (Physician Assistants and Nurse Practitioners) who all work together to provide you with the care you need, when you need it.  Your next appointment:   6 month(s)  Provider:   Redell Shallow, MD

## 2024-05-23 NOTE — Progress Notes (Addendum)
 Joan Ripple MD Reason for referral-palpitations and nonsustained ventricular tachycardia  HPI: 38 year old female for evaluation of palpitations and nonsustained ventricular tachycardia at request of Joan Ripple MD. Monitor October 2025 showed sinus bradycardia, normal sinus rhythm, sinus tachycardia, occasional PAC and PVC, 18 beats of nonsustained ventricular tachycardia versus SVT.  Laboratories October 2025 showed TSH 1.24, potassium 4.3, hemoglobin 14.5, LDL 113.  Patient denies dyspnea on exertion, orthopnea, PND, pedal edema, exertional chest pain and there is no history of syncope.  Beginning in October she had occasional palpitations described as a skip, brief flutter and occasional more sustained up to 2 minutes of heart racing.  These were associated with dizziness but no frank syncope.  No associated chest pain.  Cardiology now asked to evaluate.  Current Outpatient Medications  Medication Sig Dispense Refill   metoprolol succinate (TOPROL XL) 25 MG 24 hr tablet Take 1 tablet (25 mg total) by mouth at bedtime. 90 tablet 3   Multiple Vitamins-Minerals (MULTIVITAL PO) Take by mouth.     No current facility-administered medications for this visit.    Not on File   Past Medical History:  Diagnosis Date   Anxiety    Irritable bowel syndrome    Palpitations     Past Surgical History:  Procedure Laterality Date   WISDOM TOOTH EXTRACTION      Social History   Socioeconomic History   Marital status: Married    Spouse name: Not on file   Number of children: 2   Years of education: Not on file   Highest education level: Not on file  Occupational History   Not on file  Tobacco Use   Smoking status: Never   Smokeless tobacco: Never  Substance and Sexual Activity   Alcohol use: Yes    Comment: Rare   Drug use: Not on file   Sexual activity: Not on file  Other Topics Concern   Not on file  Social History Narrative   Not on file    Social Drivers of Health   Financial Resource Strain: Not on file  Food Insecurity: Not on file  Transportation Needs: Not on file  Physical Activity: Not on file  Stress: Not on file  Social Connections: Not on file  Intimate Partner Violence: Not on file    Family History  Problem Relation Age of Onset   CAD Neg Hx     ROS: no fevers or chills, productive cough, hemoptysis, dysphasia, odynophagia, melena, hematochezia, dysuria, hematuria, rash, seizure activity, orthopnea, PND, pedal edema, claudication. Remaining systems are negative.  Physical Exam:   Blood pressure 134/75, pulse 73, height 5' 2 (1.575 m), weight 115 lb (52.2 kg), SpO2 99%.  General:  Well developed/well nourished in NAD Skin warm/dry Patient not depressed No peripheral clubbing Back-normal HEENT-normal/normal eyelids Neck supple/normal carotid upstroke bilaterally; no bruits; no JVD; no thyromegaly chest - CTA/ normal expansion CV - RRR/normal S1 and S2; no murmurs, rubs or gallops;  PMI nondisplaced Abdomen -NT/ND, no HSM, no mass, + bowel sounds, no bruit 2+ femoral pulses, no bruits Ext-no edema, chords, 2+ DP Neuro-grossly nonfocal  EKG Interpretation Date/Time:  Wednesday May 23 2024 09:00:03 EST Ventricular Rate:  73 PR Interval:  142 QRS Duration:  84 QT Interval:  372 QTC Calculation: 409 R Axis:   75  Text Interpretation: Normal sinus rhythm Normal ECG No previous ECGs available Confirmed by Pietro Rogue ([id number]) on 05/23/2024 9:04:42 AM    A/P  1 palpitations-recent monitor shows sinus  rhythm with occasional PAC and PVC and 18 beats of nonsustained ventricular tachycardia versus SVT (will review with electrophysiology).  Will arrange echocardiogram to assess LV function.  Add Toprol 25 mg daily.  Patient also has an Apple watch.  She will forward any strips associated with her symptoms for review.  Reviewed rhythm strip with Dr. Waddell.  He felt likely nonsustained  ventricular tachycardia but difficult to differentiate.  Plan as outlined above.  Redell Shallow, MD

## 2024-06-05 ENCOUNTER — Encounter (INDEPENDENT_AMBULATORY_CARE_PROVIDER_SITE_OTHER): Payer: Self-pay

## 2024-07-02 ENCOUNTER — Telehealth (HOSPITAL_COMMUNITY): Payer: Self-pay | Admitting: Cardiology

## 2024-07-02 NOTE — Telephone Encounter (Signed)
 Patient cancelled the scheduled echocardiogram due to the expense with her insurance at this time. She will call back to reschedule if she decides to do at a alter date. I will remove the order from the active echo WQ. Thank you.

## 2024-07-03 ENCOUNTER — Ambulatory Visit (HOSPITAL_COMMUNITY)
